# Patient Record
Sex: Female | Born: 2000 | Race: White | Hispanic: No | Marital: Single | Smoking: Never smoker
Health system: Southern US, Community
[De-identification: ages and names within clinical notes are randomized; demographics above are authoritative.]

---
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
## 2022-09-27 ENCOUNTER — Ambulatory Visit
Admission: RE | Admit: 2022-09-27 | Discharge: 2022-09-27 | Disposition: A | Discharge status: Home / Self Care | Payer: 59 | Source: Ambulatory Visit | Attending: Emergency Medicine | Admitting: Emergency Medicine

## 2022-09-27 VITALS — BP 116/71 | HR 66 | Temp 98.1°F | Resp 18 | Ht 65.0 in | Wt 120.0 lb

## 2022-09-27 DIAGNOSIS — Z113 Encounter for screening for infections with a predominantly sexual mode of transmission: Secondary | ICD-10-CM | POA: Diagnosis not present

## 2022-09-27 LAB — POCT URINE PREGNANCY: Preg Test, Ur: NEGATIVE

## 2022-09-27 NOTE — ED Triage Notes (Signed)
Patient to Urgent Care for STD check. Denies any symptoms.   Also requests blood work.

## 2022-09-27 NOTE — ED Provider Notes (Addendum)
Sara Conrad    CSN: [id number] Arrival date & time: 09/27/22  1446      History   Chief Complaint Chief Complaint  Patient presents with   SEXUALLY TRANSMITTED DISEASE    Entered by patient    HPI Sara Conrad is a 21 y.o. female.  Patient presents with request for STD testing.  She denies symptoms.  She denies vaginal discharge, pelvic pain, rash, lesions, abdominal pain, dysuria, or other symptoms.  No pertinent medical history.  The history is provided by the patient and medical records.    History reviewed. No pertinent past medical history.  There are no problems to display for this patient.   History reviewed. No pertinent surgical history.  OB History   No obstetric history on file.      Home Medications    Prior to Admission medications   Not on File    Family History History reviewed. No pertinent family history.  Social History Social History   Tobacco Use   Smoking status: Never   Smokeless tobacco: Current  Vaping Use   Vaping Use: Every day  Substance Use Topics   Alcohol use: Yes    Comment: socially   Drug use: Never     Allergies   Peanut-containing drug products   Review of Systems Review of Systems  Constitutional:  Negative for chills and fever.  Gastrointestinal:  Negative for abdominal pain, diarrhea, nausea and vomiting.  Genitourinary:  Negative for dysuria, flank pain, hematuria, pelvic pain and vaginal discharge.  Skin:  Negative for rash.  All other systems reviewed and are negative.    Physical Exam Triage Vital Signs ED Triage Vitals  Enc Vitals Group     BP      Pulse      Resp      Temp      Temp src      SpO2      Weight      Height      Head Circumference      Peak Flow      Pain Score      Pain Loc      Pain Edu?      Excl. in [place]?    No data found.  Updated Vital Signs BP 116/71   Pulse 66   Temp 98.1 F (36.7 C)   Resp 18   Ht '5\' 5"'$  (1.651 m)   Wt 120 lb (54.4 kg)    LMP 09/23/2022   SpO2 98%   BMI 19.97 kg/m   Visual Acuity Right Eye Distance:   Left Eye Distance:   Bilateral Distance:    Right Eye Near:   Left Eye Near:    Bilateral Near:     Physical Exam Vitals and nursing note reviewed.  Constitutional:      General: She is not in acute distress.    Appearance: Normal appearance. She is well-developed. She is not ill-appearing.  HENT:     Mouth/Throat:     Mouth: Mucous membranes are moist.  Cardiovascular:     Rate and Rhythm: Normal rate and regular rhythm.     Heart sounds: Normal heart sounds.  Pulmonary:     Effort: Pulmonary effort is normal. No respiratory distress.     Breath sounds: Normal breath sounds.  Abdominal:     General: Bowel sounds are normal.     Palpations: Abdomen is soft.     Tenderness: There is no abdominal tenderness. There  is no right CVA tenderness, left CVA tenderness, guarding or rebound.  Musculoskeletal:     Cervical back: Neck supple.  Skin:    General: Skin is warm and dry.  Neurological:     Mental Status: She is alert.  Psychiatric:        Mood and Affect: Mood normal.        Behavior: Behavior normal.      UC Treatments / Results  Labs (all labs ordered are listed, but only abnormal results are displayed) Labs Reviewed  HIV ANTIBODY (ROUTINE TESTING W REFLEX)  RPR  POCT URINE PREGNANCY  CERVICOVAGINAL ANCILLARY ONLY    EKG   Radiology No results found.  Procedures Procedures (including critical care time)  Medications Ordered in UC Medications - No data to display  Initial Impression / Assessment and Plan / UC Course  I have reviewed the triage vital signs and the nursing notes.  Pertinent labs & imaging results that were available during my care of the patient were reviewed by me and considered in my medical decision making (see chart for details).    STD screening.  Patient obtained vaginal self swab for testing.  HIV and syphilis also pending.  Discussed that  we will call if test results are positive.  Discussed that she may require treatment at that time.  Discussed that sexual partner(s) may also require treatment.  Instructed patient to abstain from sexual activity for at least 7 days.  Instructed her to follow-up with her PCP as needed.  Patient agrees to plan of care.   Final Clinical Impressions(s) / UC Diagnoses   Final diagnoses:  Screening for STD (sexually transmitted disease)     Discharge Instructions      Your tests are pending.  If your test results are positive, we will call you.  You and your sexual partner(s) may require treatment at that time.  Do not have sexual activity for at least 7 days.    Follow up with your primary care provider as needed.        ED Prescriptions   None    PDMP not reviewed this encounter.   Sharion Balloon, NP 09/27/22 1538    Sharion Balloon, NP 09/27/22 1539

## 2022-09-27 NOTE — Discharge Instructions (Signed)
Your tests are pending.  If your test results are positive, we will call you.  You and your sexual partner(s) may require treatment at that time.  Do not have sexual activity for at least 7 days.    Follow up with your primary care provider as needed.

## 2022-09-28 LAB — CERVICOVAGINAL ANCILLARY ONLY
Bacterial Vaginitis (gardnerella): NEGATIVE
Candida Glabrata: NEGATIVE
Candida Vaginitis: NEGATIVE
Chlamydia: NEGATIVE
Comment: NEGATIVE
Comment: NEGATIVE
Comment: NEGATIVE
Comment: NEGATIVE
Comment: NEGATIVE
Comment: NORMAL
Neisseria Gonorrhea: NEGATIVE
Trichomonas: NEGATIVE

## 2022-09-28 LAB — RPR: RPR Ser Ql: NONREACTIVE

## 2022-09-28 LAB — HIV ANTIBODY (ROUTINE TESTING W REFLEX): HIV Screen 4th Generation wRfx: NONREACTIVE

## 2024-01-14 DIAGNOSIS — Z30014 Encounter for initial prescription of intrauterine contraceptive device: Secondary | ICD-10-CM | POA: Diagnosis not present

## 2024-08-22 ENCOUNTER — Other Ambulatory Visit: Payer: Self-pay

## 2024-08-22 MED ORDER — MISOPROSTOL 200 MCG PO TABS
200.0000 ug | ORAL_TABLET | Freq: Once | ORAL | 0 refills | Status: AC
  Filled 2024-08-22: qty 2, 2d supply, fill #0

## 2024-08-22 MED ORDER — OXYCODONE-ACETAMINOPHEN 5-325 MG PO TABS
1.0000 | ORAL_TABLET | Freq: Four times a day (QID) | ORAL | 0 refills | Status: AC | PRN
  Filled 2024-08-22: qty 2, 1d supply, fill #0

## 2024-08-22 MED ORDER — CLONAZEPAM 0.25 MG PO TBDP
0.2500 mg | ORAL_TABLET | Freq: Once | ORAL | 0 refills | Status: AC
  Filled 2024-08-22: qty 1, 1d supply, fill #0

## 2024-09-02 ENCOUNTER — Other Ambulatory Visit: Payer: Self-pay

## 2024-09-03 DIAGNOSIS — Z30433 Encounter for removal and reinsertion of intrauterine contraceptive device: Secondary | ICD-10-CM | POA: Diagnosis not present
